# Patient Record
Sex: Male | Born: 1965
Health system: Southern US, Community
[De-identification: ages and names within clinical notes are randomized; demographics above are authoritative.]

---

## 2005-04-06 ENCOUNTER — Emergency Department (HOSPITAL_COMMUNITY): Admission: EM | Admit: 2005-04-06 | Discharge: 2005-04-06 | Payer: Self-pay | Admitting: Emergency Medicine

## 2005-06-01 ENCOUNTER — Emergency Department (HOSPITAL_COMMUNITY): Admission: EM | Admit: 2005-06-01 | Discharge: 2005-06-01 | Payer: Self-pay | Admitting: Emergency Medicine

## 2005-06-04 ENCOUNTER — Ambulatory Visit (HOSPITAL_COMMUNITY): Admission: RE | Admit: 2005-06-04 | Discharge: 2005-06-04 | Payer: Self-pay | Admitting: Orthopaedic Surgery

## 2005-06-05 ENCOUNTER — Ambulatory Visit (HOSPITAL_COMMUNITY): Admission: RE | Admit: 2005-06-05 | Discharge: 2005-06-05 | Payer: Self-pay | Admitting: Orthopaedic Surgery

## 2005-10-27 ENCOUNTER — Inpatient Hospital Stay (HOSPITAL_COMMUNITY): Admission: EM | Admit: 2005-10-27 | Discharge: 2005-10-31 | Payer: Self-pay | Admitting: Psychiatry

## 2005-10-27 ENCOUNTER — Ambulatory Visit: Payer: Self-pay | Admitting: Psychiatry

## 2005-10-27 ENCOUNTER — Emergency Department (HOSPITAL_COMMUNITY): Admission: EM | Admit: 2005-10-27 | Discharge: 2005-10-27 | Payer: Self-pay | Admitting: Emergency Medicine

## 2006-01-06 ENCOUNTER — Emergency Department (HOSPITAL_COMMUNITY): Admission: EM | Admit: 2006-01-06 | Discharge: 2006-01-06 | Payer: Self-pay | Admitting: Emergency Medicine

## 2006-02-02 ENCOUNTER — Emergency Department (HOSPITAL_COMMUNITY): Admission: EM | Admit: 2006-02-02 | Discharge: 2006-02-02 | Payer: Self-pay | Admitting: Emergency Medicine

## 2006-02-02 ENCOUNTER — Emergency Department (HOSPITAL_COMMUNITY): Admission: EM | Admit: 2006-02-02 | Discharge: 2006-02-02 | Payer: Self-pay | Admitting: Internal Medicine

## 2006-10-08 ENCOUNTER — Emergency Department (HOSPITAL_COMMUNITY): Admission: EM | Admit: 2006-10-08 | Discharge: 2006-10-08 | Payer: Self-pay | Admitting: Emergency Medicine

## 2007-04-06 ENCOUNTER — Emergency Department (HOSPITAL_COMMUNITY): Admission: EM | Admit: 2007-04-06 | Discharge: 2007-04-06 | Payer: Self-pay | Admitting: Emergency Medicine

## 2007-08-20 ENCOUNTER — Emergency Department: Payer: Self-pay | Admitting: Emergency Medicine

## 2008-01-05 ENCOUNTER — Emergency Department: Payer: Self-pay | Admitting: Emergency Medicine

## 2009-12-02 IMAGING — CT CT HEAD WITHOUT CONTRAST
2 of 4 series · 17 of 30 positions shown, 20 images · non-contrast
Comparison: none

REASON FOR EXAM: Severe headache
COMMENTS:

PROCEDURE:     CT  - CT HEAD WITHOUT CONTRAST  - January 05, 2008  [DATE]
RESULT:     The patient has a history of headaches.
TECHNIQUE: Nonenhanced head CT is obtained.
There are no prior studies available for comparison.

[Series 2: without · axial · non-contrast · 0.41mm/px · z∈[-171,-46]mm · 11 of 31 slices shown, 14 images]
[im 3/31  brain]
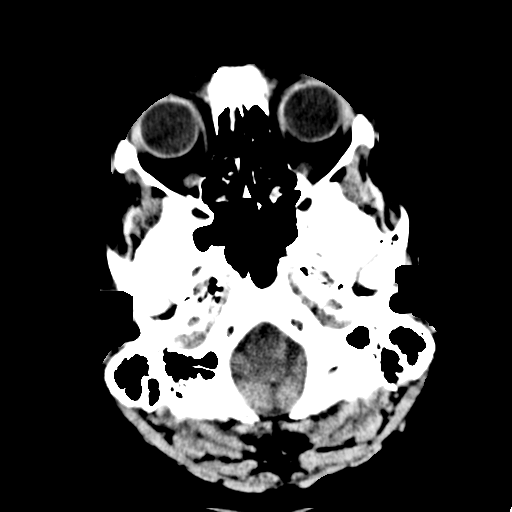
[im 3/31  bone]
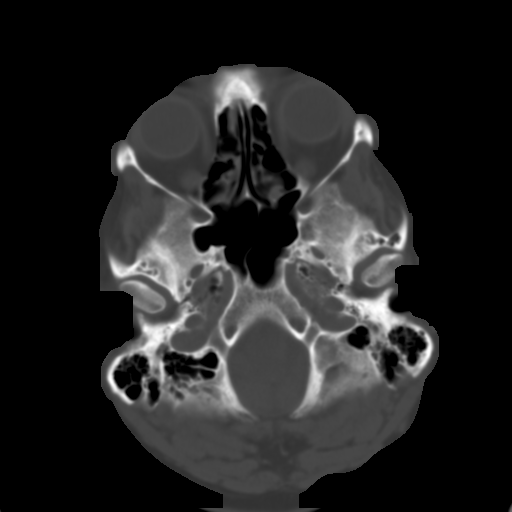
[im 6/31  brain]
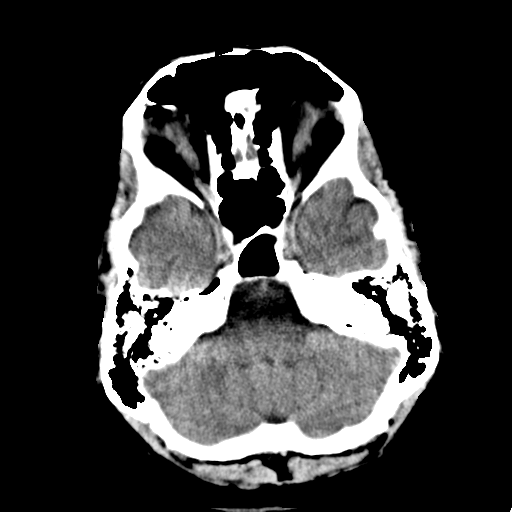
[im 8/31  brain]
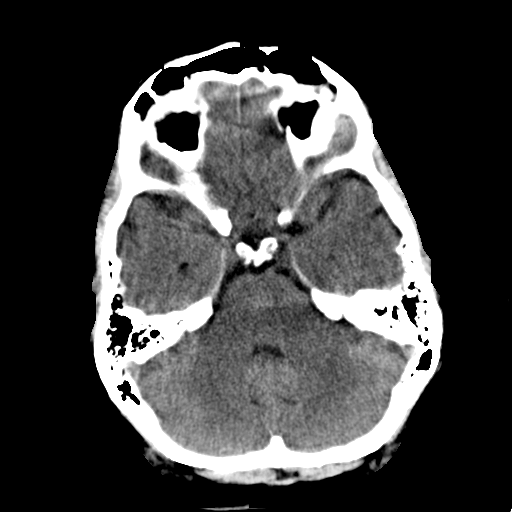
[im 11/31  brain]
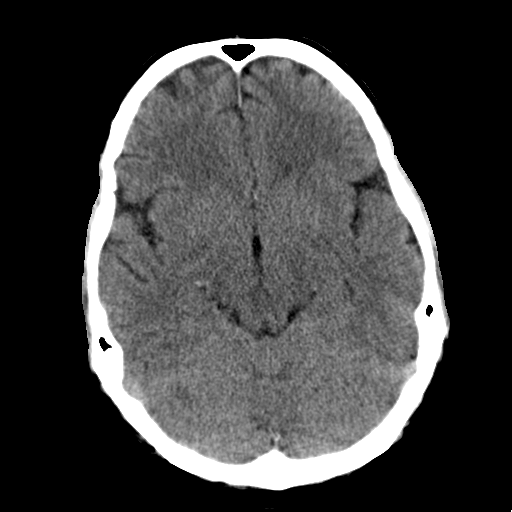
[im 13/31  brain]
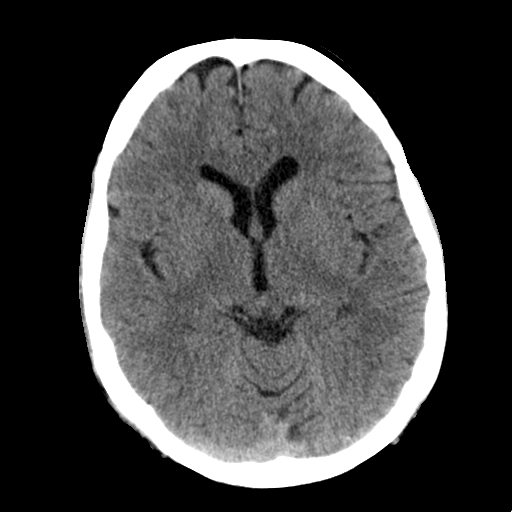
[im 13/31  bone]
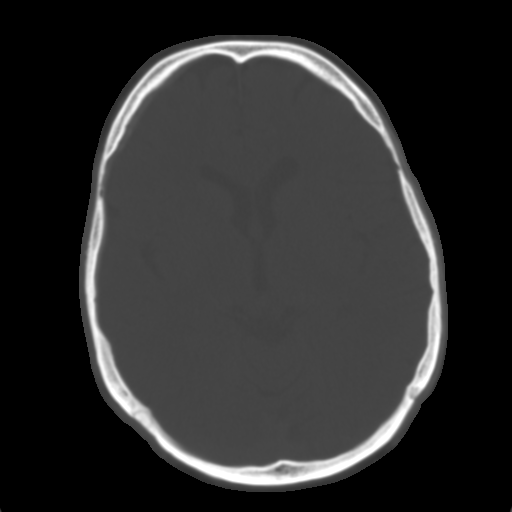
[im 16/31  brain]
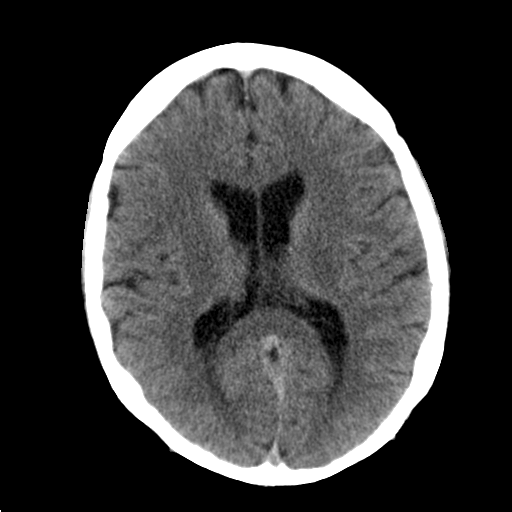
[im 18/31  brain]
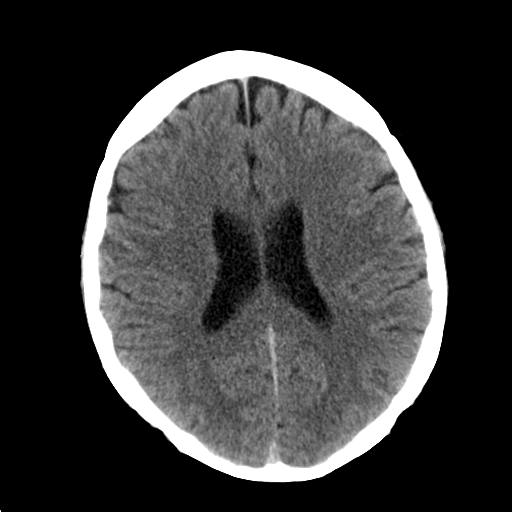
[im 21/31  brain]
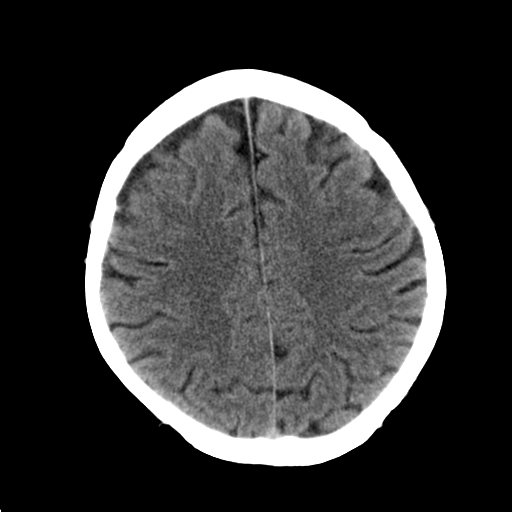
[im 23/31  brain]
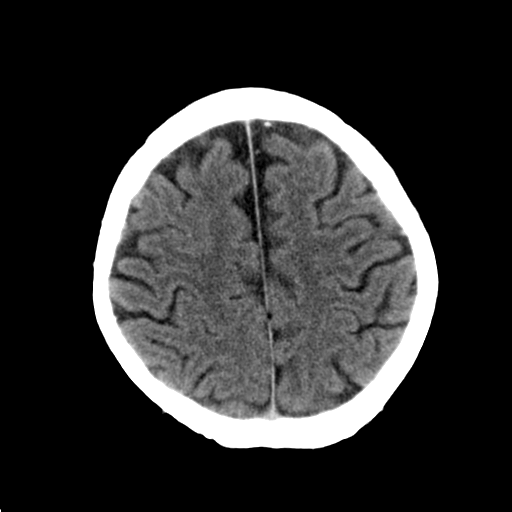
[im 23/31  bone]
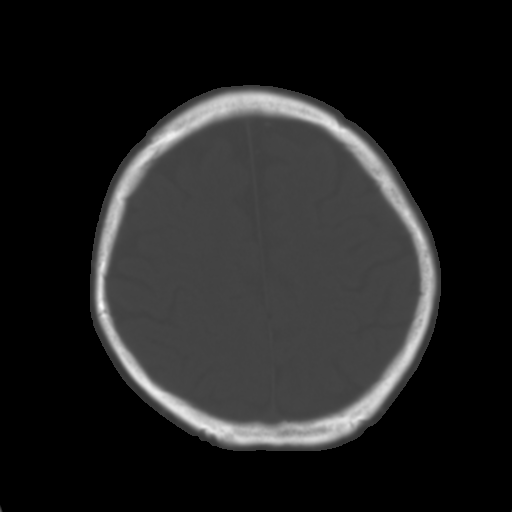
[im 26/31  brain]
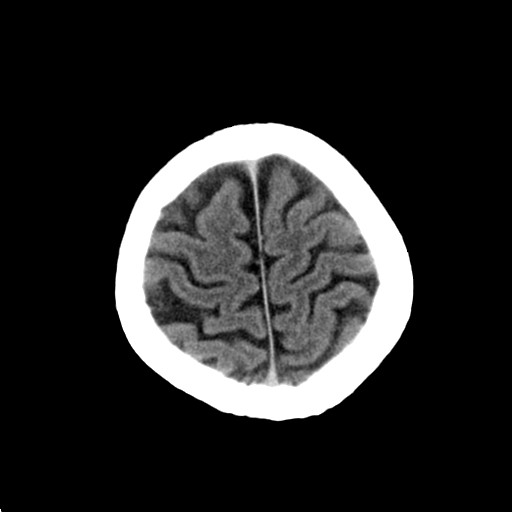
[im 28/31  brain]
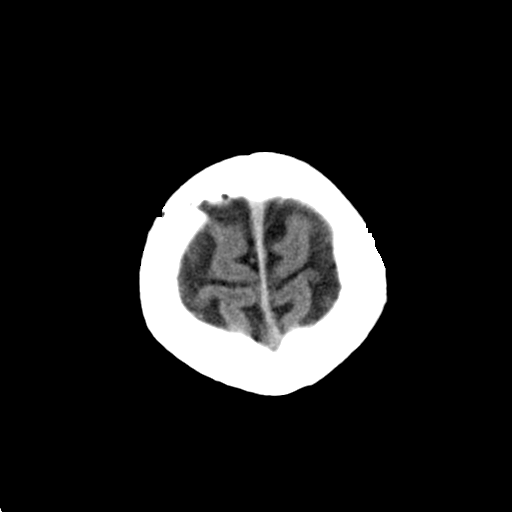

[Series 3: bone · axial · 0.41mm/px · z∈[-171,-81]mm · 6 of 31 slices shown]
[im 3/31  bone]
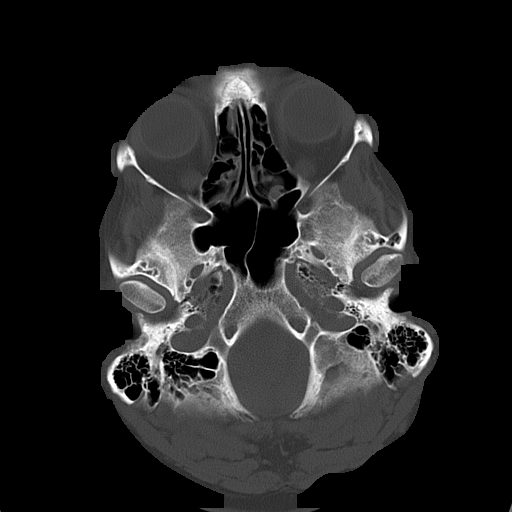
[im 8/31  bone]
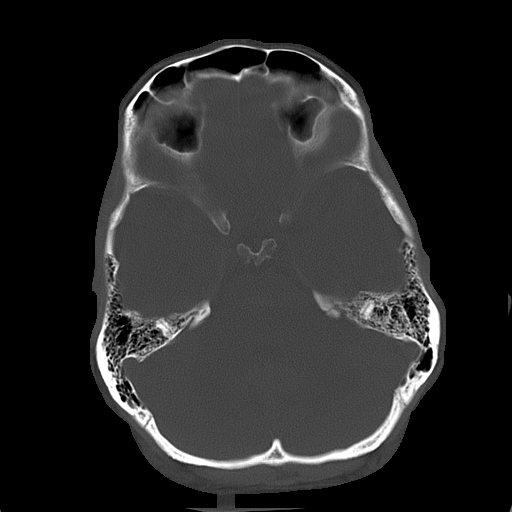
[im 11/31  bone]
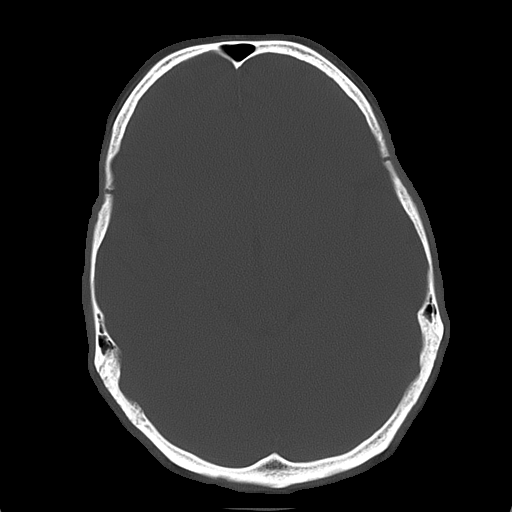
[im 13/31  bone]
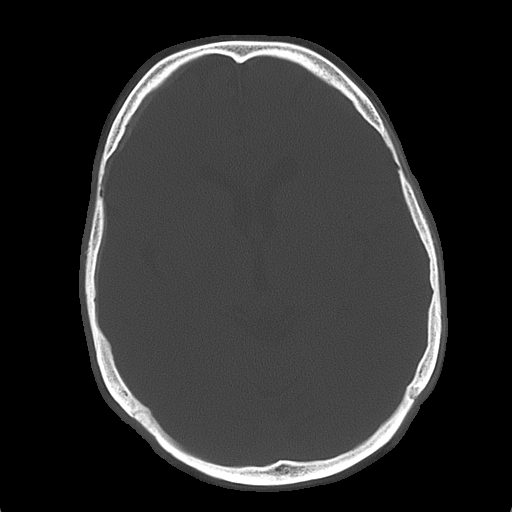
[im 18/31  bone]
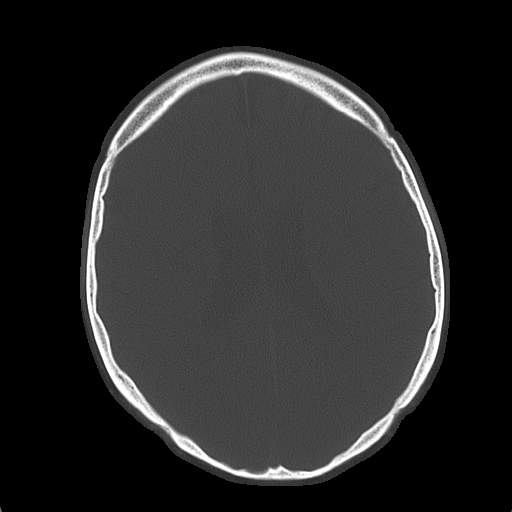
[im 21/31  bone]
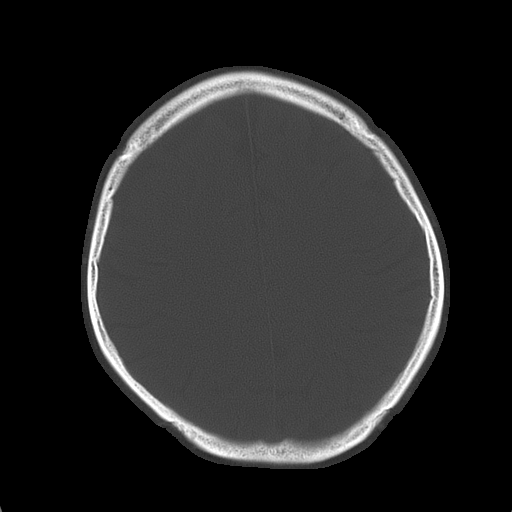

[17 of 30 positions shown; findings below may reference images not displayed]

FINDINGS: No intra-axial or extra-axial pathologic fluid or blood
collections are identified. No mass lesion is noted. There is no
hydrocephalus. There is mucosal thickening of the left maxillary sinus.
IMPRESSION: No acute or focal abnormalities are identified.

## 2010-02-10 ENCOUNTER — Encounter: Payer: Self-pay | Admitting: Orthopaedic Surgery

## 2010-06-07 NOTE — Discharge Summary (Signed)
Alex Alexander, Alex Alexander NO.:  [id number]   MEDICAL RECORD NO.:  [id number]          PATIENT TYPE:  IPS   LOCATION:  0304                          FACILITY:  [facility]   PHYSICIAN:  Norm Salt, MD  DATE OF BIRTH:  December 06, 1965   DATE OF ADMISSION:  10/27/2005  DATE OF DISCHARGE:  10/31/2005                                 DISCHARGE SUMMARY   IDENTIFYING DATA AND REASON FOR ADMISSION:  The patient is a 45 year old  single white male who lives with his father in [place].  This was a  voluntary admission.  On the day of admission the patient had called the  local sheriff requesting transportation to [facility] for help.  He was  extremely distraught.  The weekend prior he had been in jail for alleged  violation of a 50 feet order that had been filed by a former girlfriend.  The patient works as a Horticulturist, commercial.  He has had decreased sleep and appetite  for 2 weeks.  There had apparently been an incident in which he had been  quite upset, and threatening to harm himself.  Please refer to the admission  note for further details pertaining to the symptoms, circumstances and  history that led to his hospitalization.  He was given an initial Axis I  diagnosis of mood disorder NOS, rule out.   MEDICAL AND LABORATORY:  The patient came to [place] in good health without any  active or chronic medical problems.  He was continued on naproxen p.r.n. for  mild musculoskeletal pain.  There were no significant medical issues.   HOSPITAL COURSE:  The patient was admitted to the adult inpatient  psychiatric service.  He presented as a well-nourished, well-developed,  healthy-appearing and robust gentleman who was fully oriented, pleasant,  polite and cooperative throughout his inpatient stay.  Although he  frequently stated that he regretted his admission and wanted to be  discharged home, he was never irritable, demanding or inappropriate about  this request.  Explanations that he  needed to be patient and accept waiting  while we carried out a  process of evaluation and history gathering were met  with acceptance by the patient.   He did not appear depressed nor anxious, and denied depression, and suicidal  ideation.  He was able to acknowledge that he had been quite upset prior to  admission.  He stated I thought I needed some help.  I said something to  my sister about committing suicide.  I would never do that to myself.  I  did not say it.  My sister told it to the doctor at [facility].  I am  alright, I am fine.  At the time I thought I needed to be admitted because  I was going to cry a bit.   The patient came to [place] with a history of mild mental retardation, which was  somewhat evident from interaction with him.  It was felt that  in order to  get more accurate and complete history we would need to contact family  members.  The patient gave [place] permission to contact both his father and his  sister.  We later learned that his father has dementia, and so we focused on  working mostly with his sister.   Although there was concern about the possibility of delusional disorder or  mood disorder, we were not able to identify any symptoms consistent with any  specific psychiatric diagnosis, mood disorder or psychosis.  As such,  psychotropic medication trials were not initiated.  The patient remained  polite, cooperative, redirectable and appropriate throughout his inpatient  stay.   We did get reports from both his sister and a former girlfriend that he was  making an awful lot of calls on the telephone to each of them and they were  annoyed by this.  Apparently there had been a situation prior to his  admission where there had been a restraining order placed upon him because  of such activities.   On the fourth hospital day the patient was spoken to about this, and he  stated I am not going to talk to my girlfriend if she calls.  I have not  been calling her  at all.   On the third hospital day, the case manager spoke with the patient's sister  on the telephone.  She was perceived to be a very reliable source of  information.  She reported that since the patient's mother's death in  April 02, 2022, both the patient and his father had been decompensating.  Father  is reportedly a bipolar patient who refuses medications, and so is typically  unsupportive of the patient himself receiving mental health help or  medication.  The patient had been stalking women recently and the sister is  fearful that  he will be physically hurt as, in a recent situation when he  was trying to get close to a woman that he was interested in, that woman's  father came out of the house with a shotgun, fearing his daughter was in  danger.  The sister stated that the patient and the father both view her as  wanting to make things difficult for them.  The sister expressed concern  that the patient may need to live somewhere other than the current  situation, but acknowledged that probably both her father and the patient  were unwilling to consider this.  She agreed to come in for a family session  on the following day.   In that meeting, the family counselor met with the patient and his sister  and brother-in-law.  The sister and brother-in-law expressed concern that  the patient may continue to get himself into trouble.  The sister attempted  to reason with her brother to try to get him to admit to the fact that he  had been try to contact his ex-girlfriend by phone from the hospital.  The  sister and brother-in-law informed the patient that the ex-girlfriend was  likely to file a 50B against the patient, as a previous girlfriend already  had.    The patient  seemed to have difficulty understanding the seriousness of the  sister's concerns and remained focused on getting his cell phone back and paying his auto insurance.  His sister expressed concern that he had already   violated the plan put in force by a bail bondsman, which was in turn funded  by the sister and brother-in-law.   Following this meeting, the patient remained an additional day in the  inpatient program.  He  remained pleasant,  cooperative, and appropriate.  A  Burns Depression inventory scored at only 2.  He continued to request  discharge, and there did not appear to be any grounds for involuntary  commitment as the patient did not appear to have any clear indication of  psychosis, mood disorder, and was denying any intention of harming anyone,  and had no history of doing so.   At the time of discharge it was not clear whether or not the patient would  need to return to jail because of violating a restraining order.   AFTERCARE:  The patient was discharged with a plan to be contacted by  [facility] for assessment.  He was discharged without any  prescribed medications.   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  AXIS II: Deferred.  AXIS III: No acute or chronic illnesses.  AXIS IV: Stressors severe.  AXIS V: Global assessment of functioning on discharge 50.      Norm Salt, MD  Electronically Signed     SPB/MEDQ  D:  11/01/2005  T:  11/02/2005  Job:  [id number]

## 2010-10-31 LAB — I-STAT 8, (EC8 V) (CONVERTED LAB)
Acid-Base Excess: 5 — ABNORMAL HIGH
BUN: 20
Chloride: 101
Glucose, Bld: 115 — ABNORMAL HIGH
pCO2, Ven: 44.9 — ABNORMAL LOW
pH, Ven: 7.438 — ABNORMAL HIGH

## 2010-10-31 LAB — DIFFERENTIAL
Eosinophils Relative: 1
Lymphs Abs: 1.2
Monocytes Absolute: 0.8 — ABNORMAL HIGH
Neutro Abs: 7.2
Neutrophils Relative %: 77

## 2010-10-31 LAB — URINALYSIS, ROUTINE W REFLEX MICROSCOPIC
Bilirubin Urine: NEGATIVE
Glucose, UA: NEGATIVE
Nitrite: NEGATIVE
Protein, ur: NEGATIVE
pH: 6

## 2010-10-31 LAB — POCT I-STAT CREATININE: Creatinine, Ser: 1

## 2010-10-31 LAB — CBC
MCV: 85.8
Platelets: 313
WBC: 9.3

## 2011-10-27 ENCOUNTER — Encounter (HOSPITAL_COMMUNITY): Payer: Self-pay

## 2011-10-27 ENCOUNTER — Emergency Department (HOSPITAL_COMMUNITY)
Admission: EM | Admit: 2011-10-27 | Discharge: 2011-10-28 | Disposition: A | Discharge status: Home / Self Care | Payer: Self-pay | Attending: Emergency Medicine | Admitting: Emergency Medicine

## 2011-10-27 DIAGNOSIS — R42 Dizziness and giddiness: Secondary | ICD-10-CM | POA: Insufficient documentation

## 2011-10-27 DIAGNOSIS — R079 Chest pain, unspecified: Secondary | ICD-10-CM | POA: Insufficient documentation

## 2011-10-27 LAB — CBC WITH DIFFERENTIAL/PLATELET
Basophils Absolute: 0 10*3/uL (ref 0.0–0.1)
Eosinophils Absolute: 0.1 10*3/uL (ref 0.0–0.7)
HCT: 44.4 % (ref 39.0–52.0)
Hemoglobin: 15.7 g/dL (ref 13.0–17.0)
Lymphocytes Relative: 19 % (ref 12–46)
MCHC: 35.4 g/dL (ref 30.0–36.0)
MCV: 84.9 fL (ref 78.0–100.0)
Neutro Abs: 5 10*3/uL (ref 1.7–7.7)
Neutrophils Relative %: 69 % (ref 43–77)
Platelets: 259 10*3/uL (ref 150–400)
RBC: 5.23 MIL/uL (ref 4.22–5.81)
WBC: 7.2 10*3/uL (ref 4.0–10.5)

## 2011-10-27 MED ORDER — MECLIZINE HCL 25 MG PO TABS
25.0000 mg | ORAL_TABLET | Freq: Once | ORAL | Status: AC
  Administered 2011-10-27: 25 mg via ORAL
  Filled 2011-10-27: qty 1

## 2011-10-27 NOTE — ED Provider Notes (Signed)
History     CSN: [id number]  Arrival date & time 10/27/11  2331   First MD Initiated Contact with Patient 10/27/11 2332      Chief Complaint  Patient presents with  . Chest Pain    (Consider location/radiation/quality/duration/timing/severity/associated sxs/prior treatment) The history is provided by the patient.  DENVER KIBBEY is a 46 y.o. male here with dizziness. Around 10pm today, he was walking around and suddenly had some mild L sided chest pain that is dull and non radiating. Then, he had lightheadedness and dizziness. No vertigo or syncope. No medical problems but he doesn't see doctors. Not a smoker. He had a similar episode last week and had labs done at [facility].    History reviewed. No pertinent past medical history.  History reviewed. No pertinent past surgical history.  History reviewed. No pertinent family history.  History  Substance Use Topics  . Smoking status: Never Smoker   . Smokeless tobacco: Not on file  . Alcohol Use: Yes     socially      Review of Systems  Cardiovascular: Positive for chest pain.  Neurological: Positive for dizziness.  All other systems reviewed and are negative.    Allergies  Review of patient's allergies indicates no known allergies.  Home Medications  No current outpatient prescriptions on file.  BP 133/96  Pulse 75  Temp 98 F (36.7 C) (Oral)  Resp 20  SpO2 98%  Physical Exam  Nursing note and vitals reviewed. Constitutional: He is oriented to person, place, and time. He appears well-developed and well-nourished. No distress.  HENT:  Head: Normocephalic.  Mouth/Throat: Oropharynx is clear and moist.  Eyes: Conjunctivae normal are normal. Pupils are equal, round, and reactive to light.       ? Nystagmus to the L, no vertigo nystagmus.   Neck: Normal range of motion. Neck supple.  Cardiovascular: Normal rate, regular rhythm and normal heart sounds.   Pulmonary/Chest: Breath sounds normal. No  respiratory distress. He has no wheezes. He has no rales.  Abdominal: Soft. Bowel sounds are normal. He exhibits no distension. There is no tenderness.  Musculoskeletal: Normal range of motion. He exhibits no edema and no tenderness.  Neurological: He is alert and oriented to person, place, and time. No cranial nerve deficit.       Negative rhomberg, no pronator drift, nl tandem gait.   Skin: Skin is warm and dry.  Psychiatric: He has a normal mood and affect. His behavior is normal. Judgment and thought content normal.    ED Course  Procedures (including critical care time)  Labs Reviewed  COMPREHENSIVE METABOLIC PANEL - Abnormal; Notable for the following:    Glucose, Bld 137 (*)     GFR calc non Af Amer 69 (*)     GFR calc Af Amer 80 (*)     All other components within normal limits  CBC WITH DIFFERENTIAL  TROPONIN I  TROPONIN I   Dg Chest 2 View  10/28/2011  *RADIOLOGY REPORT*  Clinical Data: 46 year old male chest pain and dizziness.  CHEST - 2 VIEW  Comparison: None.  Findings: Lung volumes are within normal limits.  Cardiac size and mediastinal contours are within normal limits.  Visualized tracheal air column is within normal limits.  No pneumothorax, pulmonary edema, pleural effusion or confluent pulmonary opacity. No acute osseous abnormality identified.  IMPRESSION: No acute cardiopulmonary abnormality.   Original Report Authenticated By: Randall An, M.D.    Ct Head Wo Contrast  10/28/2011  *RADIOLOGY REPORT*  Clinical Data: 46 year old male dizziness, lightheaded, chest pain.  CT HEAD WITHOUT CONTRAST  Technique:  Contiguous axial images were obtained from the base of the skull through the vertex without contrast.  Comparison: None.  Findings: Minor ethmoid and maxillary sinus mucosal thickening. Other Visualized paranasal sinuses and mastoids are clear.  No acute osseous abnormality identified.  Visualized orbits and scalp soft tissues are within normal limits.  Cerebral  volume is within normal limits for age.  No ventriculomegaly. No midline shift, mass effect, or evidence of mass lesion.  No acute intracranial hemorrhage identified.  Pearline Cables- white matter differentiation is within normal limits throughout the brain.  No evidence of cortically based acute infarction identified.  No suspicious intracranial vascular hyperdensity.  IMPRESSION: Negative noncontrast CT appearance of the brain.   Original Report Authenticated By: Randall An, M.D.      1. Dizziness   2. Chest pain      Date: 10/27/2011  Rate: 88  Rhythm: normal sinus rhythm  QRS Axis: normal  Intervals: normal  ST/T Wave abnormalities: normal  Conduction Disutrbances:right bundle branch block and left anterior fascicular block  Narrative Interpretation:   Old EKG Reviewed: none available    MDM  YORDANY SYNDER is a 46 y.o. male here with dizziness and chest pain. Chest pain is atypical but will get trop x 2. Dizziness is possibly orthostasis vs acs vs vertigo. No concerning signs of central vertigo. Will get orthostatics, labs, trop x 2, and CT head. Will give meclizine and reassess.   3:44 AM He felt well. No dizziness or chest pain. Trop neg x 2. CT head nl. CXR nl. Will d/c home with f/u. Return precautions given.         Wandra Arthurs, MD 10/28/11 [phone]

## 2011-10-27 NOTE — ED Notes (Signed)
Per EMS, pt about an hr started feeling lightheaded and dizzy.  Pt c/o left-sided chest pain, non-radiating.  Pt not actively having any chest pain although still dizzy.  EKG showed right bundle block.

## 2011-10-28 ENCOUNTER — Emergency Department (HOSPITAL_COMMUNITY): Payer: Self-pay

## 2011-10-28 LAB — COMPREHENSIVE METABOLIC PANEL
ALT: 23 U/L (ref 0–53)
AST: 16 U/L (ref 0–37)
Albumin: 3.9 g/dL (ref 3.5–5.2)
CO2: 27 mEq/L (ref 19–32)
Calcium: 9.8 mg/dL (ref 8.4–10.5)
Creatinine, Ser: 1.22 mg/dL (ref 0.50–1.35)
GFR calc non Af Amer: 69 mL/min — ABNORMAL LOW (ref >90)
Potassium: 3.8 mEq/L (ref 3.5–5.1)
Total Bilirubin: 0.3 mg/dL (ref 0.3–1.2)

## 2011-10-28 LAB — TROPONIN I: Troponin I: <0.3 ng/mL (ref <0.30)

## 2011-10-28 MED ORDER — MECLIZINE HCL 12.5 MG PO TABS: 12.5000 mg | ORAL_TABLET | Freq: Three times a day (TID) | ORAL | Status: AC | PRN

## 2011-10-28 NOTE — ED Notes (Signed)
Prescription given with discharge instructions.  

## 2013-04-15 IMAGING — CR DG CHEST 2V
2 series · 2 of 2 positions shown · non-contrast
Comparison: None.

CLINICAL DATA: 46-year-old male chest pain and dizziness.

CHEST - 2 VIEW

[w chest pa]
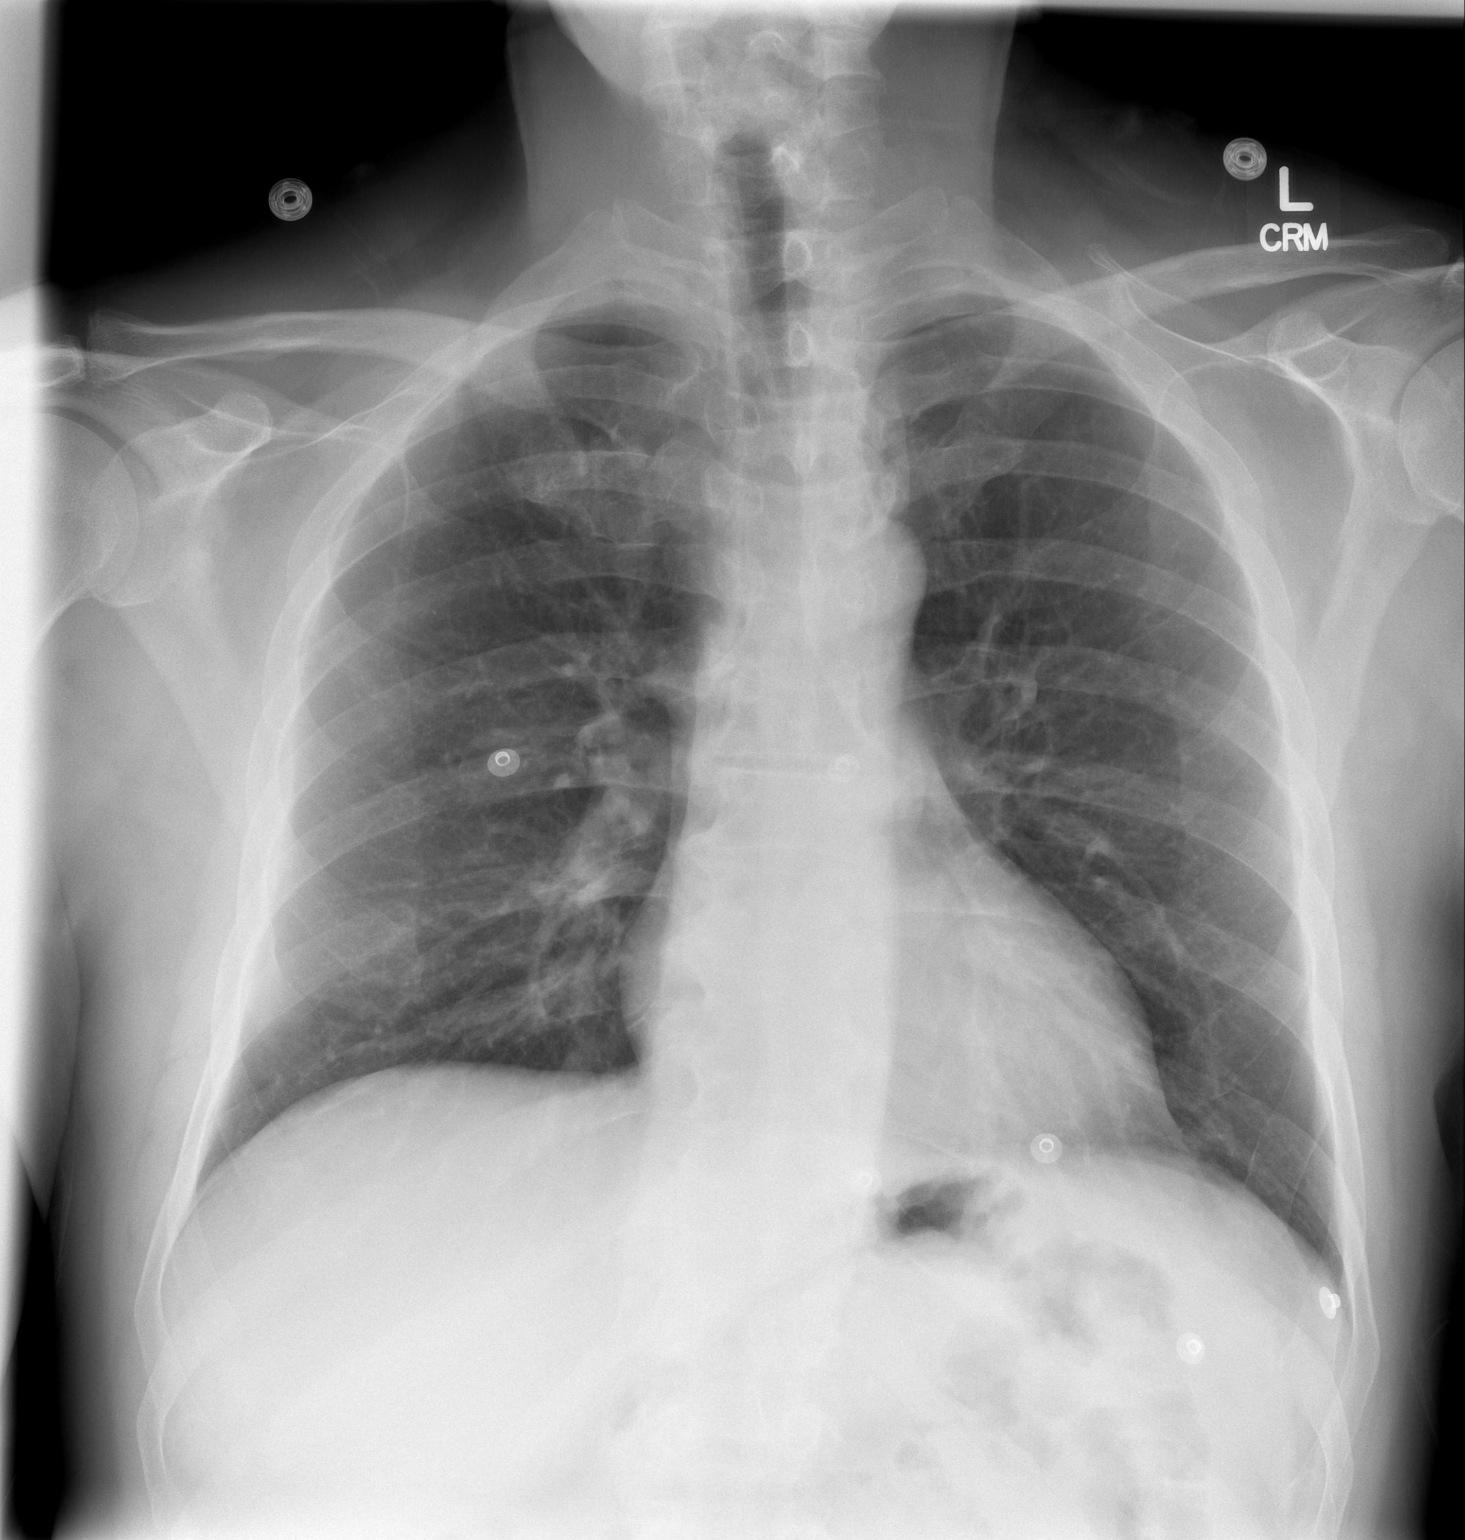

[w chest lat]
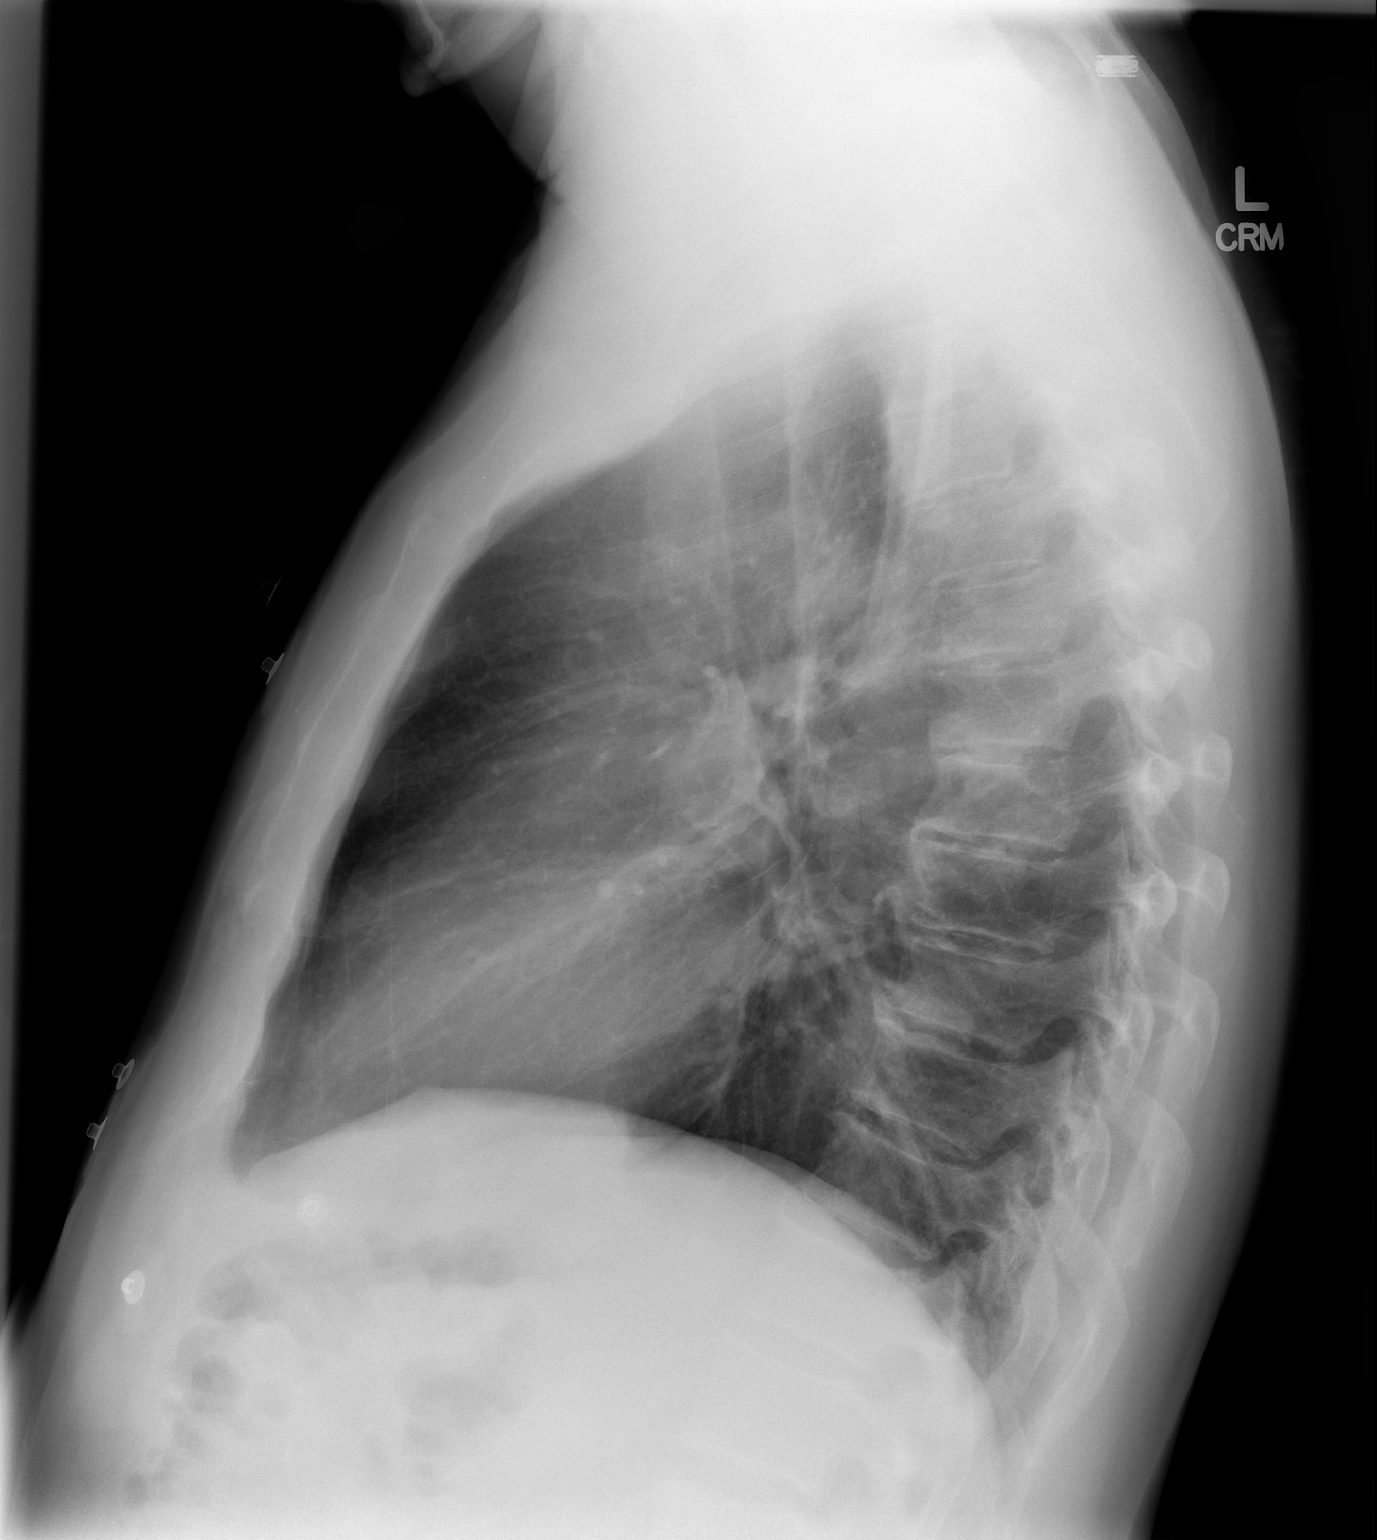

[2 of 2 positions shown; findings below may reference images not displayed]

FINDINGS: Lung volumes are within normal limits.  Cardiac size and
mediastinal contours are within normal limits.  Visualized tracheal
air column is within normal limits.  No pneumothorax, pulmonary
edema, pleural effusion or confluent pulmonary opacity. No acute
osseous abnormality identified.
IMPRESSION: No acute cardiopulmonary abnormality.

## 2020-02-02 DIAGNOSIS — Z0100 Encounter for examination of eyes and vision without abnormal findings: Secondary | ICD-10-CM | POA: Diagnosis not present

## 2020-02-02 DIAGNOSIS — H53002 Unspecified amblyopia, left eye: Secondary | ICD-10-CM | POA: Diagnosis not present

## 2020-02-14 DIAGNOSIS — R197 Diarrhea, unspecified: Secondary | ICD-10-CM | POA: Diagnosis not present

## 2020-02-14 DIAGNOSIS — R0981 Nasal congestion: Secondary | ICD-10-CM | POA: Diagnosis not present

## 2020-03-26 DIAGNOSIS — Z0189 Encounter for other specified special examinations: Secondary | ICD-10-CM | POA: Diagnosis not present

## 2020-03-26 DIAGNOSIS — Z13228 Encounter for screening for other metabolic disorders: Secondary | ICD-10-CM | POA: Diagnosis not present

## 2020-03-26 DIAGNOSIS — Z13220 Encounter for screening for lipoid disorders: Secondary | ICD-10-CM | POA: Diagnosis not present

## 2020-03-26 DIAGNOSIS — I10 Essential (primary) hypertension: Secondary | ICD-10-CM | POA: Diagnosis not present

## 2020-03-26 DIAGNOSIS — E119 Type 2 diabetes mellitus without complications: Secondary | ICD-10-CM | POA: Diagnosis not present

## 2020-03-26 DIAGNOSIS — Z125 Encounter for screening for malignant neoplasm of prostate: Secondary | ICD-10-CM | POA: Diagnosis not present

## 2020-03-26 DIAGNOSIS — R7989 Other specified abnormal findings of blood chemistry: Secondary | ICD-10-CM | POA: Diagnosis not present

## 2020-03-26 DIAGNOSIS — R946 Abnormal results of thyroid function studies: Secondary | ICD-10-CM | POA: Diagnosis not present

## 2020-03-26 DIAGNOSIS — N186 End stage renal disease: Secondary | ICD-10-CM | POA: Diagnosis not present

## 2020-07-10 DIAGNOSIS — I10 Essential (primary) hypertension: Secondary | ICD-10-CM | POA: Diagnosis not present

## 2020-07-10 DIAGNOSIS — E119 Type 2 diabetes mellitus without complications: Secondary | ICD-10-CM | POA: Diagnosis not present

## 2020-08-08 DIAGNOSIS — E119 Type 2 diabetes mellitus without complications: Secondary | ICD-10-CM | POA: Diagnosis not present

## 2020-08-08 DIAGNOSIS — I10 Essential (primary) hypertension: Secondary | ICD-10-CM | POA: Diagnosis not present

## 2020-09-10 DIAGNOSIS — Z13228 Encounter for screening for other metabolic disorders: Secondary | ICD-10-CM | POA: Diagnosis not present

## 2020-09-10 DIAGNOSIS — E119 Type 2 diabetes mellitus without complications: Secondary | ICD-10-CM | POA: Diagnosis not present

## 2020-09-10 DIAGNOSIS — R7989 Other specified abnormal findings of blood chemistry: Secondary | ICD-10-CM | POA: Diagnosis not present

## 2020-09-10 DIAGNOSIS — Z13220 Encounter for screening for lipoid disorders: Secondary | ICD-10-CM | POA: Diagnosis not present

## 2020-09-17 DIAGNOSIS — J209 Acute bronchitis, unspecified: Secondary | ICD-10-CM | POA: Diagnosis not present

## 2020-09-17 DIAGNOSIS — Z20822 Contact with and (suspected) exposure to covid-19: Secondary | ICD-10-CM | POA: Diagnosis not present

## 2020-09-18 DIAGNOSIS — J209 Acute bronchitis, unspecified: Secondary | ICD-10-CM | POA: Diagnosis not present

## 2020-09-26 ENCOUNTER — Ambulatory Visit
Admission: RE | Admit: 2020-09-26 | Discharge: 2020-09-26 | Disposition: A | Discharge status: Home / Self Care | Payer: Medicare HMO | Source: Ambulatory Visit | Attending: Urgent Care | Admitting: Urgent Care

## 2020-09-26 ENCOUNTER — Other Ambulatory Visit: Payer: Self-pay

## 2020-09-26 VITALS — BP 160/98 | HR 88 | Temp 98.2°F | Resp 18

## 2020-09-26 DIAGNOSIS — J018 Other acute sinusitis: Secondary | ICD-10-CM

## 2020-09-26 DIAGNOSIS — R519 Headache, unspecified: Secondary | ICD-10-CM

## 2020-09-26 DIAGNOSIS — R059 Cough, unspecified: Secondary | ICD-10-CM

## 2020-09-26 MED ORDER — CETIRIZINE HCL 10 MG PO TABS: 10.0000 mg | ORAL_TABLET | Freq: Every day | ORAL | 0 refills | Status: AC

## 2020-09-26 MED ORDER — AMOXICILLIN 875 MG PO TABS: 875.0000 mg | ORAL_TABLET | Freq: Two times a day (BID) | ORAL | 0 refills | Status: AC

## 2020-09-26 MED ORDER — PSEUDOEPHEDRINE HCL 30 MG PO TABS: 30.0000 mg | ORAL_TABLET | Freq: Three times a day (TID) | ORAL | 0 refills | Status: AC | PRN

## 2020-09-26 MED ORDER — PROMETHAZINE-DM 6.25-15 MG/5ML PO SYRP: 5.0000 mL | ORAL_SOLUTION | Freq: Every evening | ORAL | 0 refills | Status: AC | PRN

## 2020-09-26 NOTE — ED Triage Notes (Signed)
Pt here for cough x 3 days and some pain in left flank area; pt denies pain worsening with inspiration or cough; pt sts some sinus congestion

## 2020-09-26 NOTE — ED Provider Notes (Signed)
[place]   MRN: [id number] DOB: [birth year]  Subjective:   Alex Alexander is a 55 y.o. male presenting for 1 week history of persistent and worsening sinus headaches, sinus congestion, postnasal drainage, cough.  States the cough intermittently hurts the left side of his chest.  Denies smoking history, history of respiratory disorders, asthma.  Patient reports that he is in good health, does not have to take any medications chronically.  Reports that he was seen in different facility a week ago and was tested for COVID-19, was negative.  He is not currently taking any medications and has no known food or drug allergies.  Denies past medical and surgical history.   History reviewed. No pertinent family history.  Social History   Tobacco Use   Smoking status: Never  Substance Use Topics   Alcohol use: Yes    Comment: socially    ROS   Objective:   Vitals: BP (!) 160/98 (BP Location: Right Arm)   Pulse 88   Temp 98.2 F (36.8 C) (Oral)   Resp 18   SpO2 96%   BP Readings from Last 3 Encounters:  09/26/20 (!) 160/98  10/28/11 133/84   Physical Exam Constitutional:      General: He is not in acute distress.    Appearance: Normal appearance. He is well-developed. He is not ill-appearing, toxic-appearing or diaphoretic.  HENT:     Head: Normocephalic and atraumatic.     Right Ear: Tympanic membrane, ear canal and external ear normal.     Left Ear: Tympanic membrane, ear canal and external ear normal.     Nose: Congestion and rhinorrhea present.     Mouth/Throat:     Mouth: Mucous membranes are moist.     Pharynx: No oropharyngeal exudate or posterior oropharyngeal erythema.  Eyes:     General: No scleral icterus.       Right eye: No discharge.        Left eye: No discharge.     Extraocular Movements: Extraocular movements intact.     Conjunctiva/sclera: Conjunctivae normal.     Pupils: Pupils are equal, round, and reactive to light.  Cardiovascular:      Rate and Rhythm: Normal rate and regular rhythm.     Heart sounds: Normal heart sounds. No murmur heard.   No friction rub. No gallop.  Pulmonary:     Effort: Pulmonary effort is normal. No respiratory distress.     Breath sounds: Normal breath sounds. No stridor. No wheezing, rhonchi or rales.  Skin:    General: Skin is warm and dry.  Neurological:     Mental Status: He is alert and oriented to person, place, and time.  Psychiatric:        Mood and Affect: Mood normal.        Behavior: Behavior normal.        Thought Content: Thought content normal.    Assessment and Plan :   PDMP not reviewed this encounter.  1. Acute non-recurrent sinusitis of other sinus   2. Sinus headache   3. Cough     Patient has clear cardiopulmonary exam, therefore will defer imaging.  He already had a negative COVID test.  And given his sinus congestion, sinus headaches will address his symptoms and sinusitis.  Recommended supportive care.  Start amoxicillin to address sinusitis. Counseled patient on potential for adverse effects with medications prescribed/recommended today, ER and return-to-clinic precautions discussed, patient verbalized understanding.    Jaynee Eagles, PA-C 09/26/20  1818  

## 2020-10-03 DIAGNOSIS — R69 Illness, unspecified: Secondary | ICD-10-CM | POA: Diagnosis not present

## 2020-10-22 DIAGNOSIS — E109 Type 1 diabetes mellitus without complications: Secondary | ICD-10-CM | POA: Diagnosis not present

## 2020-11-08 ENCOUNTER — Ambulatory Visit: Payer: Medicare HMO

## 2020-11-13 ENCOUNTER — Encounter: Payer: Medicare HMO | Admitting: Nurse Practitioner

## 2020-11-13 ENCOUNTER — Other Ambulatory Visit: Payer: Self-pay

## 2020-11-13 NOTE — Progress Notes (Signed)
error 

## 2020-11-15 ENCOUNTER — Ambulatory Visit: Payer: Medicare HMO

## 2020-11-16 DIAGNOSIS — M7062 Trochanteric bursitis, left hip: Secondary | ICD-10-CM | POA: Diagnosis not present

## 2020-11-16 DIAGNOSIS — Z8269 Family history of other diseases of the musculoskeletal system and connective tissue: Secondary | ICD-10-CM | POA: Diagnosis not present

## 2020-11-16 DIAGNOSIS — E663 Overweight: Secondary | ICD-10-CM | POA: Diagnosis not present

## 2020-11-16 DIAGNOSIS — F319 Bipolar disorder, unspecified: Secondary | ICD-10-CM | POA: Diagnosis not present

## 2020-11-16 DIAGNOSIS — M25552 Pain in left hip: Secondary | ICD-10-CM | POA: Diagnosis not present

## 2020-11-16 DIAGNOSIS — Z6827 Body mass index (BMI) 27.0-27.9, adult: Secondary | ICD-10-CM | POA: Diagnosis not present

## 2020-11-16 DIAGNOSIS — M545 Low back pain, unspecified: Secondary | ICD-10-CM | POA: Diagnosis not present

## 2021-02-04 ENCOUNTER — Ambulatory Visit: Payer: Medicare HMO | Admitting: Dietician
